# Patient Record
Sex: Male | Born: 1975 | Race: White | Hispanic: No | Marital: Married | Smoking: Former smoker
Health system: Southern US, Community
[De-identification: ages and names within clinical notes are randomized; demographics above are authoritative.]

## PROBLEM LIST (undated history)

## (undated) DIAGNOSIS — F419 Anxiety disorder, unspecified: Secondary | ICD-10-CM

## (undated) DIAGNOSIS — I4510 Unspecified right bundle-branch block: Secondary | ICD-10-CM

## (undated) DIAGNOSIS — E785 Hyperlipidemia, unspecified: Secondary | ICD-10-CM

## (undated) DIAGNOSIS — I10 Essential (primary) hypertension: Secondary | ICD-10-CM

## (undated) HISTORY — DX: Essential (primary) hypertension: I10

## (undated) HISTORY — PX: PILONIDAL CYST DRAINAGE: SHX743

## (undated) HISTORY — DX: Hyperlipidemia, unspecified: E78.5

## (undated) HISTORY — DX: Unspecified right bundle-branch block: I45.10

## (undated) HISTORY — DX: Anxiety disorder, unspecified: F41.9

## (undated) HISTORY — PX: TONSILECTOMY/ADENOIDECTOMY WITH MYRINGOTOMY: SHX6125

---

## 2000-07-29 ENCOUNTER — Ambulatory Visit (HOSPITAL_BASED_OUTPATIENT_CLINIC_OR_DEPARTMENT_OTHER): Admission: RE | Admit: 2000-07-29 | Discharge: 2000-07-29 | Payer: Self-pay | Admitting: General Surgery

## 2000-07-29 ENCOUNTER — Encounter (INDEPENDENT_AMBULATORY_CARE_PROVIDER_SITE_OTHER): Payer: Self-pay | Admitting: *Deleted

## 2003-07-29 ENCOUNTER — Ambulatory Visit (HOSPITAL_BASED_OUTPATIENT_CLINIC_OR_DEPARTMENT_OTHER): Admission: RE | Admit: 2003-07-29 | Discharge: 2003-07-29 | Payer: Self-pay | Admitting: General Surgery

## 2003-07-29 ENCOUNTER — Ambulatory Visit (HOSPITAL_COMMUNITY): Admission: RE | Admit: 2003-07-29 | Discharge: 2003-07-29 | Payer: Self-pay | Admitting: General Surgery

## 2003-07-29 ENCOUNTER — Encounter (INDEPENDENT_AMBULATORY_CARE_PROVIDER_SITE_OTHER): Payer: Self-pay | Admitting: Specialist

## 2004-06-25 ENCOUNTER — Ambulatory Visit: Payer: Self-pay | Admitting: Internal Medicine

## 2004-08-07 ENCOUNTER — Ambulatory Visit: Payer: Self-pay | Admitting: Internal Medicine

## 2005-06-07 ENCOUNTER — Ambulatory Visit: Payer: Self-pay | Admitting: Internal Medicine

## 2005-11-11 ENCOUNTER — Ambulatory Visit: Payer: Self-pay | Admitting: Internal Medicine

## 2006-07-11 ENCOUNTER — Ambulatory Visit: Payer: Self-pay | Admitting: Internal Medicine

## 2006-11-26 ENCOUNTER — Ambulatory Visit: Payer: Self-pay | Admitting: Internal Medicine

## 2007-04-29 ENCOUNTER — Ambulatory Visit: Payer: Self-pay | Admitting: Internal Medicine

## 2007-04-30 LAB — CONVERTED CEMR LAB
ALT: 25 units/L (ref 0–53)
AST: 20 units/L (ref 0–37)
Alkaline Phosphatase: 56 units/L (ref 39–117)
Basophils Relative: 0.5 % (ref 0.0–1.0)
Bilirubin, Direct: 0.2 mg/dL (ref 0.0–0.3)
CO2: 31 meq/L (ref 19–32)
Calcium: 9.4 mg/dL (ref 8.4–10.5)
Creatinine, Ser: 0.9 mg/dL (ref 0.4–1.5)
Eosinophils Relative: 4 % (ref 0.0–5.0)
GFR calc Af Amer: 125 mL/min
Glucose, Bld: 93 mg/dL (ref 70–99)
Hemoglobin: 14.5 g/dL (ref 13.0–17.0)
LDL Cholesterol: 115 mg/dL — ABNORMAL HIGH (ref 0–99)
Lymphocytes Relative: 33.7 % (ref 12.0–46.0)
Neutro Abs: 3.2 10*3/uL (ref 1.4–7.7)
Platelets: 224 10*3/uL (ref 150–400)
RDW: 12.7 % (ref 11.5–14.6)
Total Bilirubin: 1.4 mg/dL — ABNORMAL HIGH (ref 0.3–1.2)
Total Protein: 7.1 g/dL (ref 6.0–8.3)
Triglycerides: 144 mg/dL (ref 0–149)
VLDL: 29 mg/dL (ref 0–40)
WBC: 6.1 10*3/uL (ref 4.5–10.5)

## 2007-07-20 ENCOUNTER — Ambulatory Visit: Payer: Self-pay | Admitting: Internal Medicine

## 2007-07-20 DIAGNOSIS — F411 Generalized anxiety disorder: Secondary | ICD-10-CM | POA: Insufficient documentation

## 2007-07-28 ENCOUNTER — Telehealth: Payer: Self-pay | Admitting: Internal Medicine

## 2007-09-09 ENCOUNTER — Ambulatory Visit: Payer: Self-pay | Admitting: Internal Medicine

## 2007-09-09 DIAGNOSIS — E785 Hyperlipidemia, unspecified: Secondary | ICD-10-CM | POA: Insufficient documentation

## 2008-03-03 ENCOUNTER — Ambulatory Visit: Payer: Self-pay | Admitting: Internal Medicine

## 2008-03-03 ENCOUNTER — Telehealth: Payer: Self-pay | Admitting: Internal Medicine

## 2008-03-03 ENCOUNTER — Emergency Department (HOSPITAL_COMMUNITY): Admission: EM | Admit: 2008-03-03 | Discharge: 2008-03-03 | Payer: Self-pay | Admitting: Family Medicine

## 2008-03-04 LAB — CONVERTED CEMR LAB
ALT: 23 units/L (ref 0–53)
AST: 20 units/L (ref 0–37)
Basophils Absolute: 0.3 10*3/uL — ABNORMAL HIGH (ref 0.0–0.1)
Basophils Relative: 2.2 % (ref 0.0–3.0)
Bilirubin, Direct: 0.3 mg/dL (ref 0.0–0.3)
CO2: 28 meq/L (ref 19–32)
Calcium: 8.8 mg/dL (ref 8.4–10.5)
Chloride: 104 meq/L (ref 96–112)
Creatinine, Ser: 1.1 mg/dL (ref 0.4–1.5)
Glucose, Bld: 112 mg/dL — ABNORMAL HIGH (ref 70–99)
Hemoglobin: 15 g/dL (ref 13.0–17.0)
LDL Cholesterol: 108 mg/dL — ABNORMAL HIGH (ref 0–99)
Leukocytes, UA: NEGATIVE
Lymphocytes Relative: 6.6 % — ABNORMAL LOW (ref 12.0–46.0)
MCHC: 34.9 g/dL (ref 30.0–36.0)
Monocytes Relative: 6.7 % (ref 3.0–12.0)
Neutro Abs: 13.2 10*3/uL — ABNORMAL HIGH (ref 1.4–7.7)
Neutrophils Relative %: 84.2 % — ABNORMAL HIGH (ref 43.0–77.0)
Nitrite: NEGATIVE
RBC: 4.81 M/uL (ref 4.22–5.81)
Sodium: 139 meq/L (ref 135–145)
Specific Gravity, Urine: >=1.03 (ref 1.000–1.03)
Total Bilirubin: 2 mg/dL — ABNORMAL HIGH (ref 0.3–1.2)
Total CHOL/HDL Ratio: 4.9
Total Protein: 6.9 g/dL (ref 6.0–8.3)
Triglycerides: 71 mg/dL (ref 0–149)
Urobilinogen, UA: 0.2 (ref 0.0–1.0)
pH: 6 (ref 5.0–8.0)

## 2008-03-07 ENCOUNTER — Ambulatory Visit: Payer: Self-pay | Admitting: Internal Medicine

## 2008-03-18 ENCOUNTER — Ambulatory Visit: Payer: Self-pay | Admitting: Internal Medicine

## 2008-08-29 ENCOUNTER — Telehealth: Payer: Self-pay | Admitting: Internal Medicine

## 2008-08-29 ENCOUNTER — Ambulatory Visit: Payer: Self-pay | Admitting: Internal Medicine

## 2008-12-09 ENCOUNTER — Ambulatory Visit: Payer: Self-pay | Admitting: Internal Medicine

## 2010-05-28 ENCOUNTER — Ambulatory Visit: Payer: Self-pay | Admitting: Internal Medicine

## 2010-06-11 ENCOUNTER — Ambulatory Visit: Payer: Self-pay | Admitting: Internal Medicine

## 2010-06-13 ENCOUNTER — Ambulatory Visit: Payer: Self-pay | Admitting: Internal Medicine

## 2010-06-13 LAB — CONVERTED CEMR LAB
Alkaline Phosphatase: 65 units/L (ref 39–117)
BUN: 22 mg/dL (ref 6–23)
Basophils Absolute: 0 10*3/uL (ref 0.0–0.1)
Bilirubin Urine: NEGATIVE
Bilirubin, Direct: 0.1 mg/dL (ref 0.0–0.3)
CO2: 30 meq/L (ref 19–32)
Calcium: 9.4 mg/dL (ref 8.4–10.5)
Chloride: 106 meq/L (ref 96–112)
Cholesterol: 171 mg/dL (ref 0–200)
Creatinine, Ser: 0.9 mg/dL (ref 0.4–1.5)
Eosinophils Absolute: 0.3 10*3/uL (ref 0.0–0.7)
HDL: 32 mg/dL — ABNORMAL LOW (ref >39.00)
Ketones, ur: NEGATIVE mg/dL
LDL Cholesterol: 124 mg/dL — ABNORMAL HIGH (ref 0–99)
Lymphocytes Relative: 32.4 % (ref 12.0–46.0)
MCHC: 34.8 g/dL (ref 30.0–36.0)
MCV: 90.4 fL (ref 78.0–100.0)
Monocytes Absolute: 0.4 10*3/uL (ref 0.1–1.0)
Neutrophils Relative %: 53.6 % (ref 43.0–77.0)
Platelets: 204 10*3/uL (ref 150.0–400.0)
RDW: 13.2 % (ref 11.5–14.6)
Specific Gravity, Urine: 1.02 (ref 1.000–1.030)
Total Bilirubin: 1.2 mg/dL (ref 0.3–1.2)
Total Protein: 6.6 g/dL (ref 6.0–8.3)
Triglycerides: 74 mg/dL (ref 0.0–149.0)
Urine Glucose: NEGATIVE mg/dL
Urobilinogen, UA: 0.2 (ref 0.0–1.0)

## 2010-06-21 ENCOUNTER — Ambulatory Visit: Payer: Self-pay | Admitting: Internal Medicine

## 2010-06-21 DIAGNOSIS — M542 Cervicalgia: Secondary | ICD-10-CM | POA: Insufficient documentation

## 2010-06-28 ENCOUNTER — Telehealth (INDEPENDENT_AMBULATORY_CARE_PROVIDER_SITE_OTHER): Payer: Self-pay | Admitting: *Deleted

## 2010-09-11 NOTE — Progress Notes (Signed)
  Phone Note Other Incoming   Request: Send information Summary of Call: Records release from [facility] forwarded to [facility].

## 2010-09-11 NOTE — Assessment & Plan Note (Signed)
Summary: URI/ AVP'S PT /NWS   Vital Signs:  Patient profile:   35 year old male Height:      72 inches Weight:      186.50 pounds BMI:     25.39 O2 Sat:      94 % on Room air Temp:     97.4 degrees F oral Pulse rate:   79 / minute Pulse rhythm:   regular Resp:     16 per minute BP sitting:   124 / 80  (left arm) Cuff size:   large  Vitals Entered By: Rock Nephew CMA (May 28, 2010 2:33 PM)  Nutrition Counseling: Patient's BMI is greater than 25 and therefore counseled on weight management options.  O2 Flow:  Room air CC: Patient c/o cough, chest congestion w/ greenish-brown phlem x 1wk, URI symptoms Is Patient Diabetic? No Pain Assessment Patient in pain? no       Does patient need assistance? Functional Status Self care Ambulation Normal   Primary Care Provider:  Tresa Garter MD  CC:  Patient c/o cough, chest congestion w/ greenish-brown phlem x 1wk, and URI symptoms.  History of Present Illness:  URI Symptoms      This is a 35 year old man who presents with URI symptoms.  The symptoms began 2 weeks ago.  The severity is described as moderate.  The patient reports sore throat, productive cough, and sick contacts, but denies nasal congestion, clear nasal discharge, purulent nasal discharge, dry cough, and earache.  The patient denies fever, stiff neck, dyspnea, wheezing, rash, vomiting, diarrhea, use of an antipyretic, and response to antipyretic.  The patient denies headache, muscle aches, and severe fatigue.  The patient denies the following risk factors for Strep sinusitis: unilateral facial pain, unilateral nasal discharge, poor response to decongestant, double sickening, tooth pain, Strep exposure, tender adenopathy, and absence of cough.    Preventive Screening-Counseling & Management  Alcohol-Tobacco     Alcohol drinks/day: 0     Smoking Status: never     Passive Smoke Exposure: no     Tobacco Counseling: not indicated; no tobacco  use  Hep-HIV-STD-Contraception     Hepatitis Risk: no risk noted     HIV Risk: no     STD Risk: no risk noted      Sexual History:  currently monogamous.        Drug Use:  never.        Blood Transfusions:  no.    Medications Prior to Update: 1)  Vitamin D3 1000 Unit  Tabs (Cholecalciferol) .Marland Kitchen.. 1 Qd 2)  Fish Oil   Oil (Fish Oil) .Marland Kitchen.. 1 By Mouth Bid 3)  Bactrim Ds 800-160 Mg Tabs (Sulfamethoxazole-Trimethoprim) .... One By Mouth Two Times A Day For 10 Days  Current Medications (verified): 1)  Vitamin D3 1000 Unit  Tabs (Cholecalciferol) .Marland Kitchen.. 1 Qd 2)  Fish Oil   Oil (Fish Oil) .Marland Kitchen.. 1 By Mouth Bid 3)  Avelox Abc Pack 400 Mg Tabs (Moxifloxacin Hcl) .... One By Mouth Once Daily For 5 Days  Allergies (verified): No Known Drug Allergies  Past History:  Past Medical History: Last updated: 09/09/2007 Anxiety Low HDL Fam h/o CAD  Past Surgical History: Last updated: 08/29/2008 Denies surgical history  Family History: Last updated: 03/18/2008 Family History of CAD Father 29 yo Family History Hypertension  Social History: Last updated: 08/29/2008 Occupation: Married 2 kids Never Smoked Alcohol use-no Drug use-no Regular exercise-yes  Risk Factors: Alcohol Use: 0 (05/28/2010) Exercise:  yes (08/29/2008)  Risk Factors: Smoking Status: never (05/28/2010) Passive Smoke Exposure: no (05/28/2010)  Family History: Reviewed history from 03/18/2008 and no changes required. Family History of CAD Father 79 yo Family History Hypertension  Social History: Reviewed history from 08/29/2008 and no changes required. Occupation: Married 2 kids Never Smoked Alcohol use-no Drug use-no Regular exercise-yes Hepatitis Risk:  no risk noted STD Risk:  no risk noted Drug Use:  never Blood Transfusions:  no  Review of Systems  The patient denies anorexia, fever, weight loss, weight gain, hoarseness, chest pain, syncope, dyspnea on exertion, peripheral edema, headaches,  hemoptysis, abdominal pain, suspicious skin lesions, transient blindness, difficulty walking, enlarged lymph nodes, and angioedema.    Physical Exam  General:  alert, well-developed, well-nourished, well-hydrated, appropriate dress, normal appearance, healthy-appearing, cooperative to examination, and good hygiene.   Head:  normocephalic, atraumatic, no abnormalities observed, and no abnormalities palpated.   Ears:  R ear normal and L ear normal.   Nose:  nasal discharge, no mucosal pallor and mucosal erythema.  no airflow obstruction, no intranasal foreign body, no nasal polyps, no nasal mucosal lesions, no mucosal friability, no active bleeding or clots, no sinus percussion tenderness, no septum abnormalities, nasal dischargemucosal pallor, and mucosal erythema.   Mouth:  good dentition, pharynx pink and moist, no exudates, no posterior lymphoid hypertrophy, no postnasal drip, no pharyngeal crowing, no lesions, no aphthous ulcers, no tongue abnormalities, no leukoplakia, and pharyngeal erythema.   Neck:  supple, full ROM, no masses, no thyromegaly, no carotid bruits, and no cervical lymphadenopathy.   Lungs:  normal respiratory effort, no intercostal retractions, no accessory muscle use, no dullness, no fremitus, no wheezes, and R base crackles.   Heart:  normal rate, regular rhythm, no murmur, no gallop, and no rub.   Abdomen:  soft, non-tender, normal bowel sounds, no distention, no masses, no guarding, no rigidity, no rebound tenderness, no hepatomegaly, and no splenomegaly.   Msk:  No deformity or scoliosis noted of thoracic or lumbar spine.   Pulses:  R and L carotid,radial,femoral,dorsalis pedis and posterior tibial pulses are full and equal bilaterally Extremities:  No clubbing, cyanosis, edema, or deformity noted with normal full range of motion of all joints.   Neurologic:  No cranial nerve deficits noted. Station and gait are normal. Plantar reflexes are down-going bilaterally. DTRs are  symmetrical throughout. Sensory, motor and coordinative functions appear intact. Skin:  Intact without suspicious lesions or rashes Cervical Nodes:  No lymphadenopathy noted Axillary Nodes:  No palpable lymphadenopathy Inguinal Nodes:  No significant adenopathy Psych:  Cognition and judgment appear intact. Alert and cooperative with normal attention span and concentration. No apparent delusions, illusions, hallucinations   Impression & Recommendations:  Problem # 1:  BRONCHITIS-ACUTE (ICD-466.0) Assessment New  The following medications were removed from the medication list:    Bactrim Ds 800-160 Mg Tabs (Sulfamethoxazole-trimethoprim) ..... One by mouth two times a day for 10 days His updated medication list for this problem includes:    Avelox Abc Pack 400 Mg Tabs (Moxifloxacin hcl) ..... One by mouth once daily for 5 days  Take antibiotics and other medications as directed. Encouraged to push clear liquids, get enough rest, and take acetaminophen as needed. To be seen in 5-7 days if no improvement, sooner if worse.  Problem # 2:  COUGH (ICD-786.2) Assessment: New will check for pneumonia Orders: T-2 View CXR (71020TC)  Complete Medication List: 1)  Vitamin D3 1000 Unit Tabs (Cholecalciferol) .Marland Kitchen.. 1 qd 2)  Fish Oil  Oil (Fish oil) .Marland Kitchen.. 1 by mouth bid 3)  Avelox Abc Pack 400 Mg Tabs (Moxifloxacin hcl) .... One by mouth once daily for 5 days  Patient Instructions: 1)  Please schedule a follow-up appointment in 1 month. 2)  Take your antibiotic as prescribed until ALL of it is gone, but stop if you develop a rash or swelling and contact our office as soon as possible. 3)  Acute bronchitis symptoms for less than 10 days are not helped by antibiotics. take over the counter cough medications. call if no improvment in  5-7 days, sooner if increasing cough, fever, or new symptoms( shortness of breath, chest pain). Prescriptions: AVELOX ABC PACK 400 MG TABS (MOXIFLOXACIN HCL) One by mouth  once daily for 5 days  #5 x 0   Entered and Authorized by:   Etta Grandchild MD   Signed by:   Etta Grandchild MD on 05/28/2010   Method used:   Samples Given   RxID:   [id number]    Orders Added: 1)  T-2 View CXR [71020TC] 2)  Est. Patient Level IV [21308]
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
## 2010-12-28 NOTE — Op Note (Signed)
NAME:  DEMARRIO, MENGES A                           ACCOUNT NO.:  [id number]   MEDICAL RECORD NO.:  [id number]                   PATIENT TYPE:  AMB   LOCATION:  [facility]                                  FACILITY:  [facility]   PHYSICIAN:  Adolph Pollack, M.D.            DATE OF BIRTH:  09-Jun-1976   DATE OF PROCEDURE:  07/29/2003  DATE OF DISCHARGE:                                 OPERATIVE REPORT   PREOPERATIVE DIAGNOSIS:  Chronically-inflamed pilonidal cyst.   POSTOPERATIVE DIAGNOSIS:  Chronically-inflamed pilonidal cyst.   PROCEDURE:  Extensive pilonidal cystectomy by way of open technique.   SURGEON:  Adolph Pollack, M.D.   ANESTHESIA:  General.   INDICATIONS FOR PROCEDURE:  The patient is a 35 year old male who has had  intermittent problems with pain and drainage from pilonidal cysts.  It had  been coming and going for the past two years, and now he presents for an  excision of the areas.  He has three separate areas in the gluteal cleft  region.   DESCRIPTION OF PROCEDURE:  He is seen in the holding area and then brought  to the operating room and placed supine on the stretcher.  A general  anesthetic was administered.  He was then placed in the prone position with  appropriate padding.  The gluteal cleft area was shaved, retracted with  tape, and then sterilely prepped and draped.  An elliptical incision was  made to include all the inflamed areas, as well as normal skin.  The cautery  was used to excise the subcutaneous tissue down to the presacral fascia, and  the specimen was handed off the field.  The wound was inspected, and cautery was used for hemostasis.  Plain  Marcaine was then injected in the superficial and deep tissues of the wound  for local anesthetic effect.  The wound was inspected again, and when it was  hemostatic, a moist gauze was packed into the wound tightly, followed by a  bulky dry dressing.  He subsequently was turned back into the supine position,  extubated, and  taken to the recovery room in satisfactory condition.  There were no  apparent complications.   DISPOSITION:  His wife is a Engineer, civil (consulting), so I have asked her to start his dressing  changes in two to three days with normal saline, down to dry dressing  changes.  He will be seen back in the office in a couple of weeks for  followup.  He was given Tylox for pain.                                               Adolph Pollack, M.D.    Kari Baars  D:  07/29/2003  T:  07/31/2003  Job:  [id number]

## 2011-10-04 IMAGING — CR DG CHEST 2V
2 series · 2 of 2 positions shown · non-contrast
Comparison: None.

CLINICAL DATA: Cough, shortness of breath

CHEST - 2 VIEW

[view not recorded (1 of 2)]
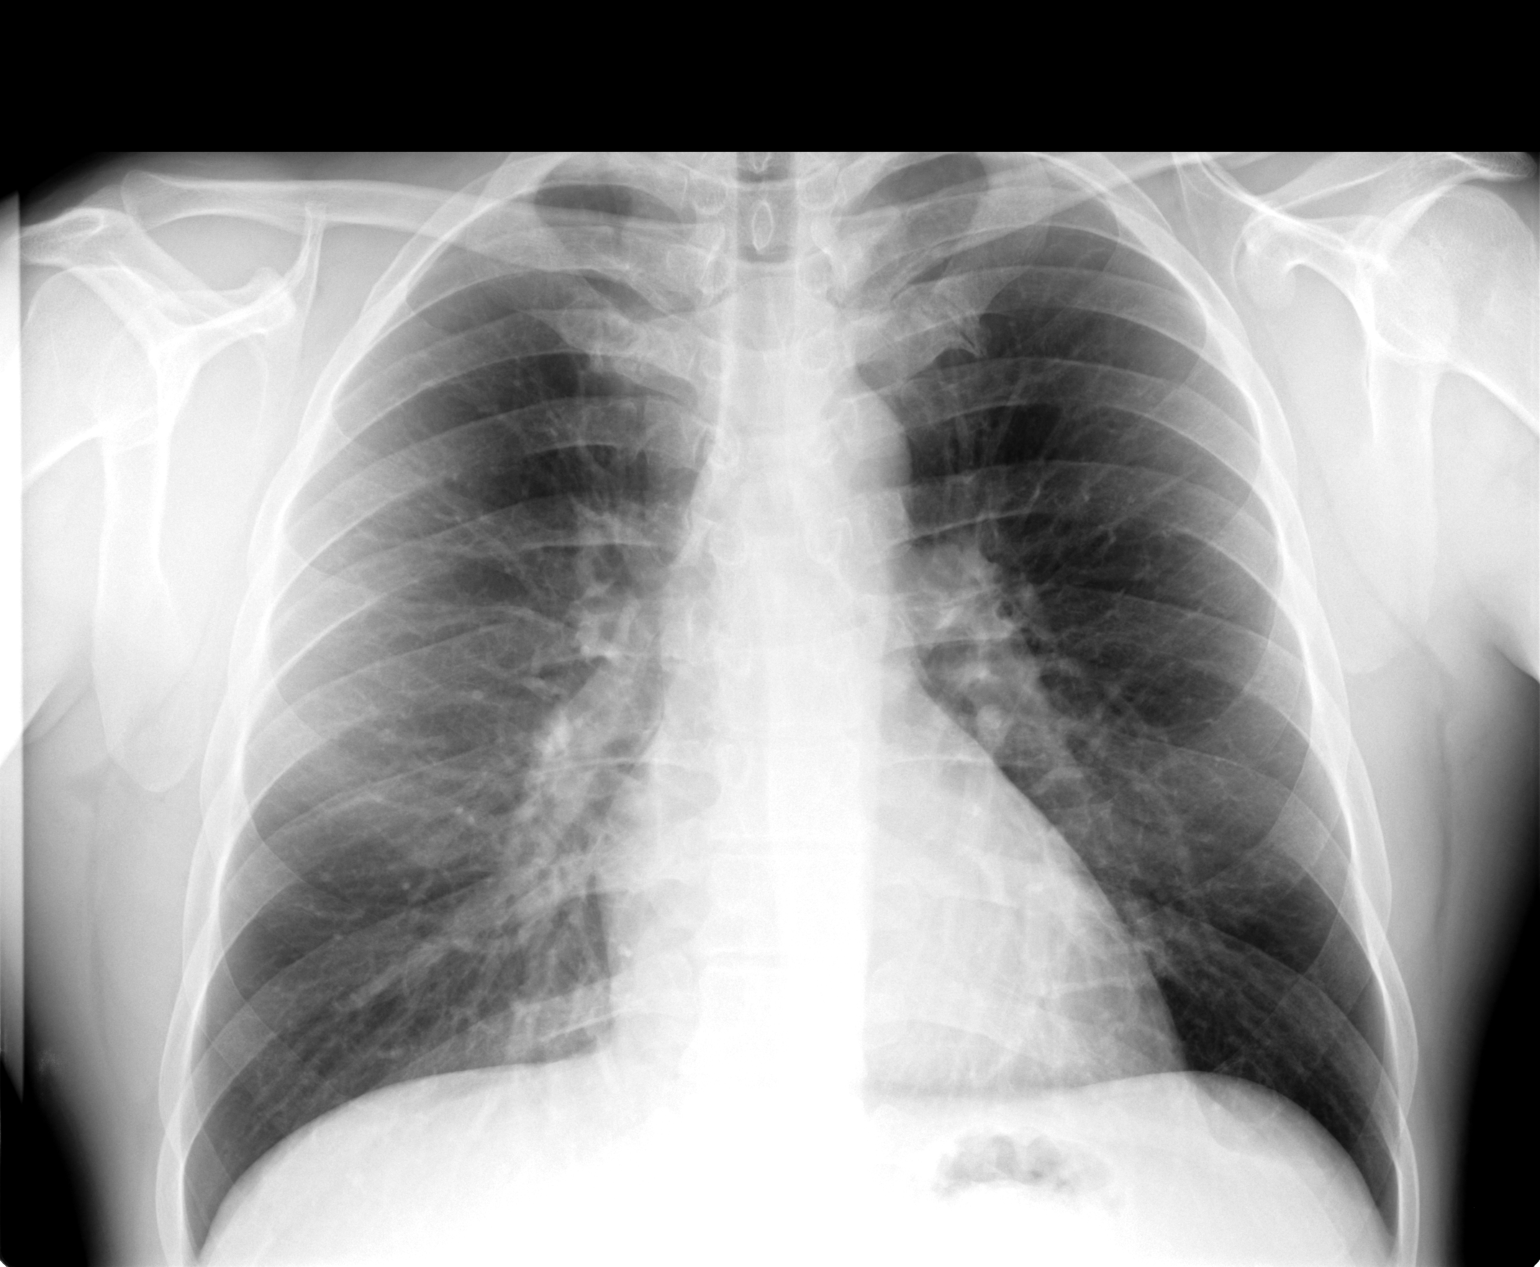

[view not recorded (2 of 2)]
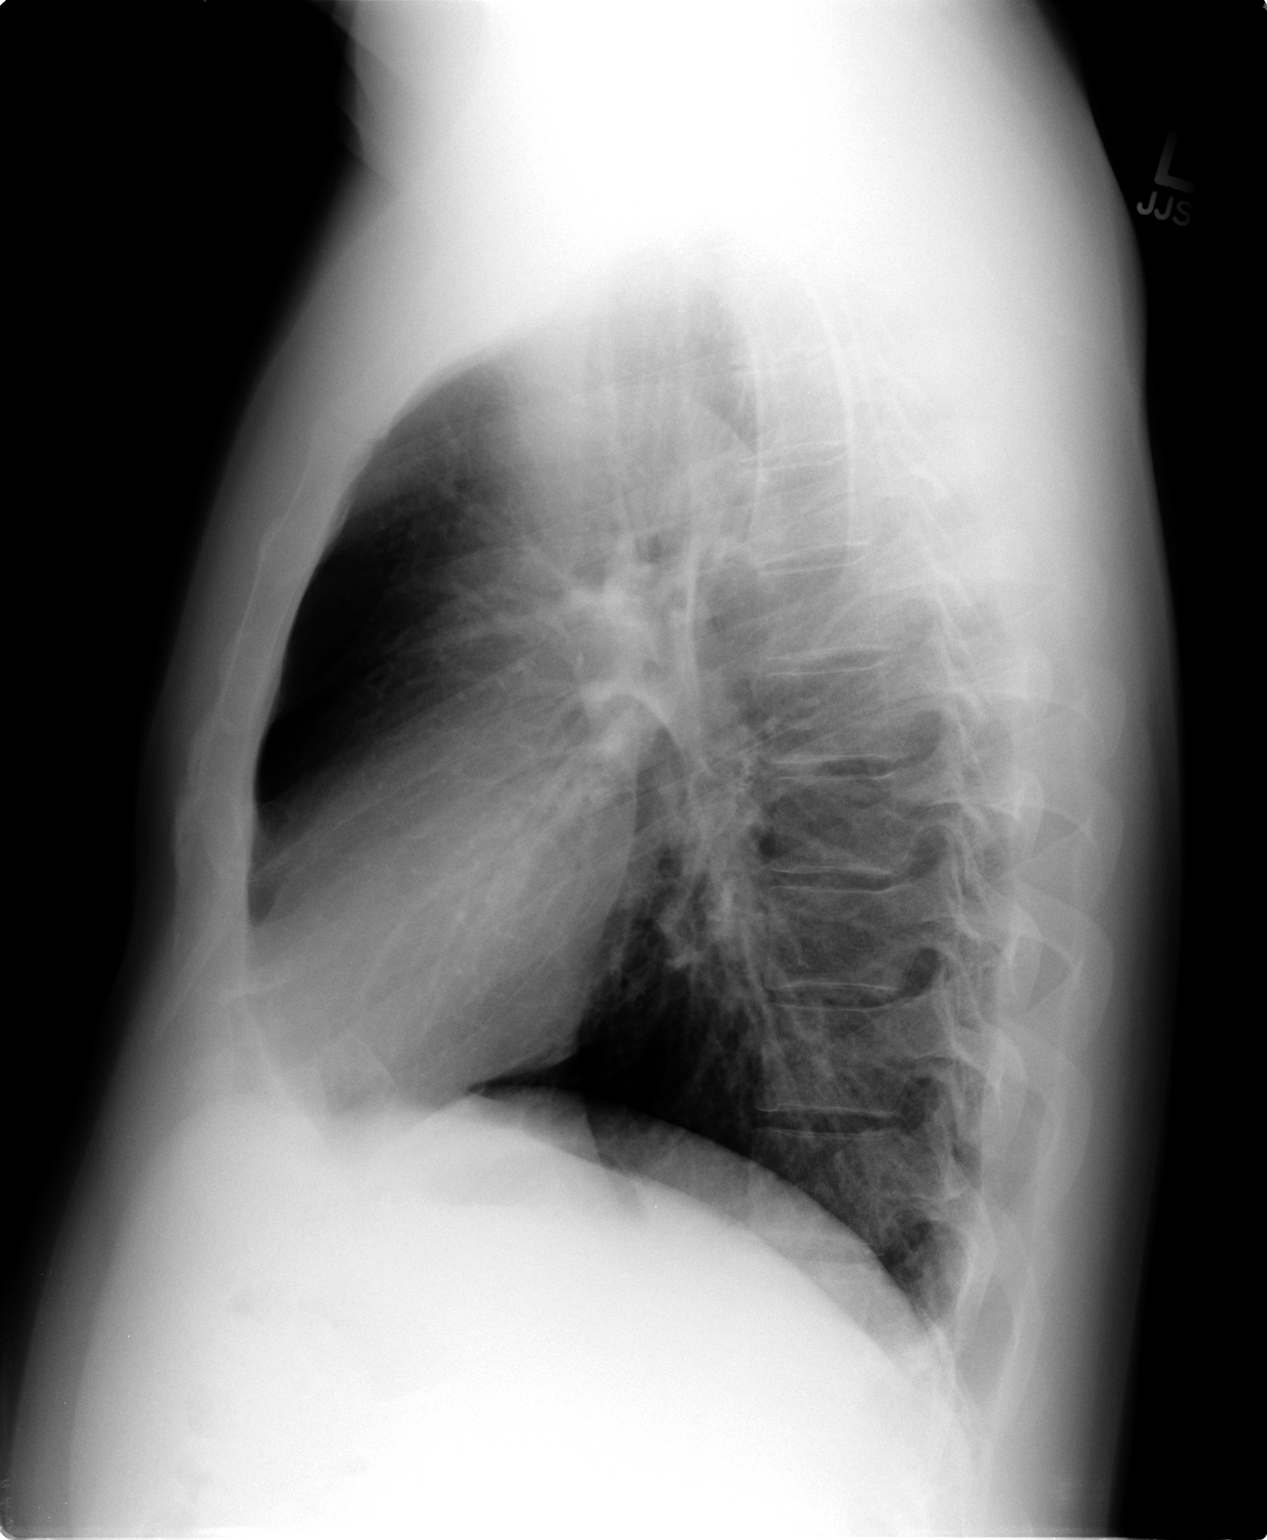

[2 of 2 positions shown; findings below may reference images not displayed]

FINDINGS: The lungs are clear bilaterally.  No confluent airspace
opacities, pleural effuions or pneumothoracies are seen.  The heart
is normal in size and contour.  The upper abdomen and osseous
structures are normal.
IMPRESSION: No acute cardiopulmonary disease.

## 2015-10-03 ENCOUNTER — Encounter (INDEPENDENT_AMBULATORY_CARE_PROVIDER_SITE_OTHER): Payer: 59

## 2015-10-03 DIAGNOSIS — I499 Cardiac arrhythmia, unspecified: Secondary | ICD-10-CM | POA: Diagnosis not present

## 2018-02-17 ENCOUNTER — Ambulatory Visit (INDEPENDENT_AMBULATORY_CARE_PROVIDER_SITE_OTHER): Payer: 59 | Admitting: Cardiology

## 2018-02-17 ENCOUNTER — Encounter: Payer: Self-pay | Admitting: Cardiology

## 2018-02-17 VITALS — BP 142/96 | HR 62 | Ht 70.6 in | Wt 186.0 lb

## 2018-02-17 DIAGNOSIS — E785 Hyperlipidemia, unspecified: Secondary | ICD-10-CM | POA: Diagnosis not present

## 2018-02-17 DIAGNOSIS — Z8249 Family history of ischemic heart disease and other diseases of the circulatory system: Secondary | ICD-10-CM | POA: Diagnosis not present

## 2018-02-17 DIAGNOSIS — R0789 Other chest pain: Secondary | ICD-10-CM

## 2018-02-17 DIAGNOSIS — R079 Chest pain, unspecified: Secondary | ICD-10-CM

## 2018-02-17 DIAGNOSIS — I4510 Unspecified right bundle-branch block: Secondary | ICD-10-CM

## 2018-02-17 NOTE — Progress Notes (Signed)
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 The pt tells me saw Johnny Johnny Day in the past and had a what he describes as a nuclear stress test that was normal. There is also a Holter monitor in the records that showed PACS and short runs of PAT in 2017. The patient does have treated HLD and a FM Hx of early CAD. He has a chronic RBBB. The pt tells me after his bronchitis he noted left sided "tightness". In retrospect he admits it was positional. It has completely resolved and he denies any unusual DOE or chest pain.    Current Outpatient Medications  Medication Sig Dispense Refill  . atorvastatin (LIPITOR) 10 MG tablet Take 10 mg by mouth daily.    . fluticasone (FLONASE ALLERGY RELIEF) 50 MCG/ACT nasal spray Place into both nostrils as needed for allergies or rhinitis.    . sildenafil (REVATIO) 20 MG tablet Take 20 mg by mouth as needed.     No current facility-administered medications for this visit.     Allergies no known allergies  Past Medical History:  Diagnosis Date  . Dyslipidemia   . RBBB     Social History   Socioeconomic History  . Marital status: Married    Spouse name: Not on file  . Number of children: Not on file  . Years of education: Not on file  . Highest education level: Not on file  Occupational History  . Not on file  Social Needs  . Financial resource strain: Not on file  . Food insecurity:    Worry: Not on file    Inability: Not on file  . Transportation needs:    Medical: Not on file    Non-medical: Not on file  Tobacco Use  . Smoking status: Former Smoker    Packs/day: 10.00    Years: 15.00    Pack years: 150.00    Types: Cigarettes    Last attempt to quit: 1999    Years since quitting: 20.5  .  Smokeless tobacco: Never Used  Substance and Sexual Activity  . Alcohol use: Yes    Comment: social occassions  . Drug use: Not on file  . Sexual activity: Not on file  Lifestyle  . Physical activity:    Days per week: Not on file    Minutes per session: Not on file  . Stress: Not on file  Relationships  . Social connections:    Talks on phone: Not on file    Gets together: Not on file    Attends religious service: Not on file    Active member of club or organization: Not on file    Attends meetings of clubs or organizations: Not on file    Relationship status: Not on file  . Intimate partner violence:    Fear of current or ex partner: Not on file    Emotionally abused: Not on file    Physically abused: Not on file    Forced sexual activity: Not on file  Other Topics Concern  . Not on file  Social History Narrative  . Not on file     Family History  Problem Relation Age of Onset  . Hypertension  Mother   . CAD Father        MI in 7150's  . Stroke Father      Review of Systems: General: negative for chills, fever, night sweats or weight changes.  Cardiovascular: negative for chest pain, dyspnea on exertion, edema, orthopnea, palpitations, paroxysmal nocturnal dyspnea or shortness of breath Dermatological: negative for rash Respiratory: negative for cough or wheezing Urologic: negative for hematuria Abdominal: negative for nausea, vomiting, diarrhea, bright red blood per rectum, melena, or hematemesis Neurologic: negative for visual changes, syncope, or dizziness All other systems reviewed and are otherwise negative except as noted above.    Blood pressure (!) 142/96, pulse 62, height 5' 10.6" (1.793 m), weight 186 lb (84.4 kg), SpO2 99 %.  General appearance: alert, cooperative and no distress Neck: no carotid bruit and no JVD Lungs: clear to auscultation bilaterally Heart: regular rate and rhythm Abdomen: soft, non-tender; bowel sounds normal; no masses,  no  organomegaly Extremities: extremities normal, atraumatic, no cyanosis or edema Pulses: 2+ and symmetric Skin: Skin color, texture, turgor normal. No rashes or lesions Neurologic: Grossly normal  Repeat B/P by me with regular cuff- 122/80  EKG NSR, RBBB  ASSESSMENT AND PLAN:   Chest pain, atypical Pt had left sided chest pain after an episode of bronchitis- now resolved  RBBB Chronic  Dyslipidemia Followed by Johnny Day- patient informed his LDL goal is 100 or less  Family history of coronary artery disease in father Father had an MI in his 7350's   PLAN  Discussed with Johnny Johnny SabalBerry today in the office. We recommend risk factor modification.             Goal LDL is 100 or less             Goal B/P is close to 120/80- he has been advised to monitor his B/P a few times a week              Exercise 150 minutes a week- more cardio, less weights at the gym              I discussed symptoms of typical angina and urged him to contact us if any arise.             We will be happy to see him in the future as needed.  Johnny ShelterLuke Ormand Senn PA-C 02/17/2018 9:35 AM

## 2018-02-17 NOTE — Assessment & Plan Note (Signed)
Father had an MI in his 50's 

## 2018-02-17 NOTE — Assessment & Plan Note (Addendum)
Chronic. 

## 2018-02-17 NOTE — Assessment & Plan Note (Signed)
Pt had left sided chest pain after an episode of bronchitis- now resolved

## 2018-02-17 NOTE — Patient Instructions (Signed)
Follow Up as needed. No changes have been made today.      Fat and Cholesterol Restricted Diet Getting too much fat and cholesterol in your diet may cause health problems. Following this diet helps keep your fat and cholesterol at normal levels. This can keep you from getting sick. What types of fat should I choose?  Choose monosaturated and polyunsaturated fats. These are found in foods such as olive oil, canola oil, flaxseeds, walnuts, almonds, and seeds.  Eat more omega-3 fats. Good choices include salmon, mackerel, sardines, tuna, flaxseed oil, and ground flaxseeds.  Limit saturated fats. These are in animal products such as meats, butter, and cream. They can also be in plant products such as palm oil, palm kernel oil, and coconut oil.  Avoid foods with partially hydrogenated oils in them. These contain trans fats. Examples of foods that have trans fats are stick margarine, some tub margarines, cookies, crackers, and other baked goods. What general guidelines do I need to follow?  Check food labels. Look for the words "trans fat" and "saturated fat."  When preparing a meal: ? Fill half of your plate with vegetables and green salads. ? Fill one fourth of your plate with whole grains. Look for the word "whole" as the first word in the ingredient list. ? Fill one fourth of your plate with lean protein foods.  Eat more foods that have fiber, like apples, carrots, beans, peas, and barley.  Eat more home-cooked foods. Eat less at restaurants and buffets.  Limit or avoid alcohol.  Limit foods high in starch and sugar.  Limit fried foods.  Cook foods without frying them. Baking, boiling, grilling, and broiling are all great options.  Lose weight if you are overweight. Losing even a small amount of weight can help your overall health. It can also help prevent diseases such as diabetes and heart disease. What foods can I eat? Grains Whole grains, such as whole wheat or whole  grain breads, crackers, cereals, and pasta. Unsweetened oatmeal, bulgur, barley, quinoa, or brown rice. Corn or whole wheat flour tortillas. Vegetables Fresh or frozen vegetables (raw, steamed, roasted, or grilled). Green salads. Fruits All fresh, canned (in natural juice), or frozen fruits. Meat and Other Protein Products Ground beef (85% or leaner), grass-fed beef, or beef trimmed of fat. Skinless chicken or [place]. Ground chicken or [place]. Pork trimmed of fat. All fish and seafood. Eggs. Dried beans, peas, or lentils. Unsalted nuts or seeds. Unsalted canned or dry beans. Dairy Low-fat dairy products, such as skim or 1% milk, 2% or reduced-fat cheeses, low-fat ricotta or cottage cheese, or plain low-fat yogurt. Fats and Oils Tub margarines without trans fats. Light or reduced-fat mayonnaise and salad dressings. Avocado. Olive, canola, sesame, or safflower oils. Natural peanut or almond butter (choose ones without added sugar and oil). The items listed above may not be a complete list of recommended foods or beverages. Contact your dietitian for more options. What foods are not recommended? Grains White bread. White pasta. White rice. Cornbread. Bagels, pastries, and croissants. Crackers that contain trans fat. Vegetables White potatoes. Corn. Creamed or fried vegetables. Vegetables in a cheese sauce. Fruits Dried fruits. Canned fruit in light or heavy syrup. Fruit juice. Meat and Other Protein Products Fatty cuts of meat. Ribs, chicken wings, bacon, sausage, bologna, salami, chitterlings, fatback, hot dogs, bratwurst, and packaged luncheon meats. Liver and organ meats. Dairy Whole or 2% milk, cream, half-and-half, and cream cheese. Whole milk cheeses. Whole-fat or sweetened yogurt. Full-fat cheeses. Nondairy  creamers and whipped toppings. Processed cheese, cheese spreads, or cheese curds. Sweets and Desserts Corn syrup, sugars, honey, and molasses. Candy. Jam and jelly. Syrup. Sweetened  cereals. Cookies, pies, cakes, donuts, muffins, and ice cream. Fats and Oils Butter, stick margarine, lard, shortening, ghee, or bacon fat. Coconut, palm kernel, or palm oils. Beverages Alcohol. Sweetened drinks (such as sodas, lemonade, and fruit drinks or punches). The items listed above may not be a complete list of foods and beverages to avoid. Contact your dietitian for more information. This information is not intended to replace advice given to you by your health care provider. Make sure you discuss any questions you have with your health care provider. Document Released: 01/28/2012 Document Revised: 04/04/2016 Document Reviewed: 10/28/2013 Elsevier Interactive Patient Education  [employer].

## 2018-02-17 NOTE — Assessment & Plan Note (Signed)
Followed by Dr Renne CriglerPharr- patient informed his LDL goal is 100 or less

## 2021-03-06 ENCOUNTER — Other Ambulatory Visit: Payer: Self-pay | Admitting: Internal Medicine

## 2021-03-06 DIAGNOSIS — E7800 Pure hypercholesterolemia, unspecified: Secondary | ICD-10-CM

## 2021-03-07 ENCOUNTER — Encounter: Payer: Self-pay | Admitting: Gastroenterology

## 2021-03-28 ENCOUNTER — Ambulatory Visit
Admission: RE | Admit: 2021-03-28 | Discharge: 2021-03-28 | Disposition: A | Discharge status: Home / Self Care | Payer: No Typology Code available for payment source | Source: Ambulatory Visit | Attending: Internal Medicine | Admitting: Internal Medicine

## 2021-03-28 DIAGNOSIS — E7800 Pure hypercholesterolemia, unspecified: Secondary | ICD-10-CM

## 2021-04-04 ENCOUNTER — Encounter: Payer: Self-pay | Admitting: Gastroenterology

## 2021-04-04 ENCOUNTER — Ambulatory Visit (INDEPENDENT_AMBULATORY_CARE_PROVIDER_SITE_OTHER): Payer: 59 | Admitting: Gastroenterology

## 2021-04-04 VITALS — BP 120/70 | HR 70 | Ht 71.0 in | Wt 209.6 lb

## 2021-04-04 DIAGNOSIS — K625 Hemorrhage of anus and rectum: Secondary | ICD-10-CM | POA: Diagnosis not present

## 2021-04-04 DIAGNOSIS — Z1211 Encounter for screening for malignant neoplasm of colon: Secondary | ICD-10-CM

## 2021-04-04 DIAGNOSIS — Z5320 Procedure and treatment not carried out because of patient's decision for unspecified reasons: Secondary | ICD-10-CM | POA: Insufficient documentation

## 2021-04-04 DIAGNOSIS — R109 Unspecified abdominal pain: Secondary | ICD-10-CM | POA: Insufficient documentation

## 2021-04-04 MED ORDER — PLENVU 140 G PO SOLR: 1.0000 | ORAL | 0 refills | Status: DC

## 2021-04-04 NOTE — Patient Instructions (Signed)
It was my pleasure to provide care to you today. Based on our discussion, I am providing you with my recommendations below:  RECOMMENDATION(S):   COLONOSCOPY:   You have been scheduled for a colonoscopy. Please follow written instructions given to you at your visit today.   PREP:   Please pick up your prep supplies at the pharmacy within the next 1-3 days.  INHALERS:   If you use inhalers (even only as needed), please bring them with you on the day of your procedure.  COLONOSCOPY TIPS:  To reduce nausea and dehydration, stay well hydrated for 3-4 days prior to the exam.  To prevent skin/hemorrhoid irritation - prior to wiping, put A&Dointment or vaseline on the toilet paper. Keep a towel or pad on the bed.  BEFORE STARTING YOUR PREP, drink  64oz of clear liquids in the morning. This will help to flush the colon and will ensure you are well hydrated!!!!  NOTE - This is in addition to the fluids required for to complete your prep. Use of a flavored hard candy, such as grape Rubin Payor, can counteract some of the flavor of the prep and may prevent some nausea.   FOLLOW UP:  After your procedure, you will receive a call from my office staff regarding my recommendation for follow up.  BMI:   If you are age 33 or younger, your body mass index should be between 19-25. Your Body mass index is 29.23 kg/m. If this is out of the aformentioned range listed, please consider follow up with your Primary Care Provider.   MY CHART:  The [place] [facility] providers would like to encourage you to use [facility] to communicate with providers for non-urgent requests or questions.  Due to long hold times on the telephone, sending your provider a message by [facility] may be a faster and more efficient way to get a response.  Please allow 48 business hours for a response.  Please remember that this is for non-urgent requests.   Thank you for trusting me with your gastrointestinal care!    Doug Sou  PA-C

## 2021-04-04 NOTE — Progress Notes (Signed)
04/04/2021 Johnny Day [id number] April 13, 1976   HISTORY OF PRESENT ILLNESS: This is a 45 year old male who is new to our practice.  He is here today at the request of his PCP, Dr. Renne Crigler, for evaluation regarding rectal bleeding and abdominal pain.  He tells me that about a month ago he had an episode of rectal bleeding.  This is described as bright red blood on the toilet paper that lasted about a day or so.  He states that he soaked in the bathtub and then the bleeding resolved.  He says that he has never had a colonoscopy in the past.  For the past week to week and a half he has been experiencing some left-sided abdominal/flank pain.  At its worst he says it is about a 2 or 3 out of 10 on the pain scale.  He says that it is just a constant dull aching.  He says that his bowels are moving fine, no change in bowel habits.  He said maybe some mild nausea at times, but nothing significant.  No fevers or chills.  He says that he thought maybe he pulled a muscle while golfing or something, but it does not really hurt with movement.  Is also not affected by eating or bowel habits.   Past Medical History:  Diagnosis Date   Anxiety    Dyslipidemia    Hypertension    RBBB    Past Surgical History:  Procedure Laterality Date   PILONIDAL CYST DRAINAGE     TONSILECTOMY/ADENOIDECTOMY WITH MYRINGOTOMY      reports that he quit smoking about 23 years ago. His smoking use included cigarettes. He has a 150.00 pack-year smoking history. He has never used smokeless tobacco. He reports current alcohol use. He reports that he does not use drugs. family history includes CAD in his father; Heart disease in his father; Hypertension in his mother; Stroke in his father. No Known Allergies    Outpatient Encounter Medications as of 04/04/2021  Medication Sig   amLODipine (NORVASC) 10 MG tablet Take 10 mg by mouth daily.   atorvastatin (LIPITOR) 10 MG tablet Take 10 mg by mouth daily.   fluticasone (FLONASE)  50 MCG/ACT nasal spray Place into both nostrils as needed for allergies or rhinitis.   losartan-hydrochlorothiazide (HYZAAR) 100-12.5 MG tablet Take 1 tablet by mouth daily.   sildenafil (REVATIO) 20 MG tablet Take 20 mg by mouth as needed.   No facility-administered encounter medications on file as of 04/04/2021.     REVIEW OF SYSTEMS  : All other systems reviewed and negative except where noted in the History of Present Illness.   PHYSICAL EXAM: BP 120/70   Pulse 70   Ht 5\' 11"  (1.803 m)   Wt 209 lb 9.6 oz (95.1 kg)   SpO2 98%   BMI 29.23 kg/m  General: Well developed white male in no acute distress Head: Normocephalic and atraumatic Eyes:  Sclerae anicteric, conjunctiva pink. Ears: Normal auditory acuity Lungs: Clear throughout to auscultation; no W/R/R. Heart: Regular rate and rhythm; no M/R/G. Abdomen: Soft, non-distended.  BS present.  Non-tender. Rectal:  Will be done at the time of colonoscopy. Musculoskeletal: Symmetrical with no gross deformities  Skin: No lesions on visible extremities Extremities: No edema  Neurological: Alert oriented x 4, grossly non-focal Psychological:  Alert and cooperative. Normal mood and affect  ASSESSMENT AND PLAN: *Left sided abdominal/left flank pain:  Not tender on exam.  ? Musculoskeletal source.  Only about a  2 or 3 out of 10 on the pain scale at its worst.  Advised to try Tylenol, heating pad, topical Salonpas patches, etc. for now. *Rectal bleeding:  One episode about a month ago, bright red blood.  Never had a colonoscopy in the past.  Will schedule with Dr. Myrtie Neither.  The risks, benefits, and alternatives to colonoscopy were discussed with the patient and he consents to proceed.  *CRC screening  CC:  Merri Brunette, MD

## 2021-04-05 NOTE — Progress Notes (Addendum)
04/06/21 - Lab results received and given to Dr. Myrtie Neither for review.  04/05/21 - Called PCP's office to inquire if pt had recent CBC. Confirmed pt had CBC drawn on 04/02/21. Requested labs be faxed to our office @ [phone]. Reminder created for myself to f/u on receipt of these labs.

## 2021-04-05 NOTE — Progress Notes (Addendum)
____________________________________________________________  Attending physician addendum:  Thank you for sending this case to me. I have reviewed the entire note and agree with the plan.  Please have this patient come by the lab one day next week for a CBC if he did not have one with primary care prior to the visit with [place].  Amada Jupiter, MD   Addendum 04/06/2021  Labs obtained from PCP office  On 04/02/2021, WBC 6.1, hemoglobin 14.8, hematocrit 42.8, platelet 256  - H. Myrtie Neither, MD  ____________________________________________________________

## 2021-04-17 ENCOUNTER — Encounter: Payer: 59 | Admitting: Gastroenterology

## 2021-04-19 ENCOUNTER — Encounter: Payer: 59 | Admitting: Gastroenterology

## 2021-05-02 ENCOUNTER — Other Ambulatory Visit: Payer: Self-pay | Admitting: Urology

## 2021-05-02 ENCOUNTER — Encounter: Payer: 59 | Admitting: Gastroenterology

## 2021-05-02 DIAGNOSIS — N402 Nodular prostate without lower urinary tract symptoms: Secondary | ICD-10-CM

## 2021-05-29 ENCOUNTER — Ambulatory Visit
Admission: RE | Admit: 2021-05-29 | Discharge: 2021-05-29 | Disposition: A | Discharge status: Home / Self Care | Payer: 59 | Source: Ambulatory Visit | Attending: Urology | Admitting: Urology

## 2021-05-29 DIAGNOSIS — N402 Nodular prostate without lower urinary tract symptoms: Secondary | ICD-10-CM

## 2021-05-29 MED ORDER — GADOBENATE DIMEGLUMINE 529 MG/ML IV SOLN
20.0000 mL | Freq: Once | INTRAVENOUS | Status: AC | PRN
  Administered 2021-05-29: 20 mL via INTRAVENOUS

## 2022-06-04 ENCOUNTER — Encounter: Payer: 59 | Admitting: Gastroenterology

## 2022-07-16 DIAGNOSIS — N402 Nodular prostate without lower urinary tract symptoms: Secondary | ICD-10-CM | POA: Insufficient documentation

## 2022-07-18 DIAGNOSIS — Z8042 Family history of malignant neoplasm of prostate: Secondary | ICD-10-CM | POA: Insufficient documentation

## 2022-10-17 ENCOUNTER — Emergency Department (HOSPITAL_COMMUNITY)
Admission: EM | Admit: 2022-10-17 | Discharge: 2022-10-17 | Disposition: A | Discharge status: Home / Self Care | Payer: BC Managed Care – PPO | Attending: Emergency Medicine | Admitting: Emergency Medicine

## 2022-10-17 ENCOUNTER — Encounter (HOSPITAL_COMMUNITY): Payer: Self-pay

## 2022-10-17 DIAGNOSIS — L0291 Cutaneous abscess, unspecified: Secondary | ICD-10-CM

## 2022-10-17 DIAGNOSIS — Z87891 Personal history of nicotine dependence: Secondary | ICD-10-CM | POA: Insufficient documentation

## 2022-10-17 DIAGNOSIS — Z1152 Encounter for screening for COVID-19: Secondary | ICD-10-CM | POA: Insufficient documentation

## 2022-10-17 DIAGNOSIS — J40 Bronchitis, not specified as acute or chronic: Secondary | ICD-10-CM | POA: Diagnosis not present

## 2022-10-17 DIAGNOSIS — R059 Cough, unspecified: Secondary | ICD-10-CM | POA: Diagnosis present

## 2022-10-17 DIAGNOSIS — R042 Hemoptysis: Secondary | ICD-10-CM | POA: Insufficient documentation

## 2022-10-17 MED ORDER — LIDOCAINE-EPINEPHRINE (PF) 2 %-1:200000 IJ SOLN
10.0000 mL | Freq: Once | INTRAMUSCULAR | Status: AC
  Administered 2022-10-17: 10 mL
  Filled 2022-10-17: qty 20

## 2022-10-17 MED ORDER — DOXYCYCLINE HYCLATE 100 MG PO CAPS: 100.0000 mg | ORAL_CAPSULE | Freq: Two times a day (BID) | ORAL | 0 refills | Status: DC

## 2022-10-17 NOTE — ED Provider Notes (Signed)
[facility] Provider Note   CSN: [id number] Arrival date & time: 10/17/22  [id number]     History  Chief Complaint  Patient presents with   Abscess    Johnny Day is a 47 y.o. male.  Johnny Day is a 47 y.o. male with a history of hypertension, dyslipidemia and anxiety, who presents to the ED with concern for possible abscess on the right side of his back.  He reports he initially thought this was either a cyst or a lipoma and had been there for about a year.  He saw a dermatologist for it 6 months ago and they told him not to worry about it unless it started to cause pain or bother him.  2 weeks ago he notes that it started to become more irritated and uncomfortable, but over the past 4 days it has been red, hot and very tender to the touch.  He has not noted any drainage from the area.  No fevers or chills.  Reports remote history of some prior abscesses, no underlying history of diabetes.  No other aggravating or alleviating factors.  The history is provided by the patient.  Abscess Associated symptoms: no fever        Home Medications Prior to Admission medications   Medication Sig Start Date End Date Taking? Authorizing Provider  amLODipine (NORVASC) 10 MG tablet Take 10 mg by mouth daily.    [provider]  atorvastatin (LIPITOR) 10 MG tablet Take 10 mg by mouth daily.    [provider]  doxycycline (VIBRAMYCIN) 100 MG capsule Take 1 capsule (100 mg total) by mouth 2 (two) times daily. 10/17/22   Jacqlyn Larsen, PA-C  fluticasone [facility] [place]) 50 MCG/ACT nasal spray Place into both nostrils as needed for allergies or rhinitis.    [provider]  losartan-hydrochlorothiazide (HYZAAR) 100-12.5 MG tablet Take 1 tablet by mouth daily.    [provider]  PEG-KCl-NaCl-NaSulf-Na Asc-C (PLENVU) 140 g SOLR Take 1 kit by mouth as directed. 04/04/21   Doran Stabler, MD  sildenafil (REVATIO) 20 MG tablet Take  20 mg by mouth as needed.    [provider]      Allergies    Patient has no known allergies.    Review of Systems   Review of Systems  Constitutional:  Negative for chills and fever.  Skin:  Positive for color change.       Abscess    Physical Exam Updated Vital Signs BP (!) 139/96 (BP Location: Right Arm)   Pulse 69   Temp 98.3 F (36.8 C) (Oral)   Resp 16   Ht '5\' 11"'$  (1.803 m)   Wt 97.1 kg   SpO2 100%   BMI 29.85 kg/m  Physical Exam Vitals and nursing note reviewed.  Constitutional:      General: He is not in acute distress.    Appearance: Normal appearance. He is well-developed. He is not ill-appearing or diaphoretic.  HENT:     Head: Normocephalic and atraumatic.  Eyes:     General:        Right eye: No discharge.        Left eye: No discharge.  Pulmonary:     Effort: Pulmonary effort is normal. No respiratory distress.  Musculoskeletal:     Comments: 3 x 3 cm area of erythema and fluctuance in the right mid back with a few centimeters of additional surrounding erythema, small amount  of expressible purulent drainage with palpation.  Skin:    General: Skin is warm and dry.  Neurological:     Mental Status: He is alert and oriented to person, place, and time.     Coordination: Coordination normal.  Psychiatric:        Mood and Affect: Mood normal.        Behavior: Behavior normal.     ED Results / Procedures / Treatments   Labs (all labs ordered are listed, but only abnormal results are displayed) Labs Reviewed - No data to display  EKG None  Radiology No results found.  Procedures .Marland KitchenIncision and Drainage  Date/Time: 10/17/2022 11:55 AM  Performed by: Jacqlyn Larsen, PA-C Authorized by: Jacqlyn Larsen, PA-C   Consent:    Consent obtained:  Verbal   Consent given by:  Patient   Risks, benefits, and alternatives were discussed: yes     Risks discussed:  Bleeding, incomplete drainage, pain, damage to other organs and infection    Alternatives discussed:  No treatment Universal protocol:    Procedure explained and questions answered to patient or proxy's satisfaction: yes     Patient identity confirmed:  Verbally with patient Location:    Type:  Abscess   Size:  3 x3 cm   Location:  Trunk   Trunk location:  Back Pre-procedure details:    Skin preparation:  Chlorhexidine Sedation:    Sedation type:  None Anesthesia:    Anesthesia method:  Local infiltration   Local anesthetic:  Lidocaine 2% WITH epi Procedure type:    Complexity:  Complex Procedure details:    Ultrasound guidance: yes     Needle aspiration: no     Incision types:  Single straight   Incision depth:  Dermal   Wound management:  Probed and deloculated and irrigated with saline   Drainage:  Bloody and purulent   Drainage amount:  Moderate   Wound treatment:  Wound left open   Packing materials:  1/4 in iodoform gauze Post-procedure details:    Procedure completion:  Tolerated well, no immediate complications Ultrasound ED Soft Tissue  Date/Time: 10/17/2022 12:02 PM  Performed by: Jacqlyn Larsen, PA-C Authorized by: Jacqlyn Larsen, PA-C   Procedure details:    Indications: localization of abscess and evaluate for cellulitis     Transverse view:  Visualized   Longitudinal view:  Visualized   Images: archived   Location:    Location: lower back   Findings:     abscess present     Medications Ordered in ED Medications  lidocaine-EPINEPHrine (XYLOCAINE W/EPI) 2 %-1:200000 (PF) injection 10 mL (10 mLs Infiltration Given 10/17/22 1130)    ED Course/ Medical Decision Making/ A&P                             Medical Decision Making  Patient presents to the ED with abscess amenable to I&D based on exam confirmed on bedside US. Procedure per note above.  Moderate size abscess, iodoform gauze used for packing. Given mild surrounding cellulitis will start patient on Doxycycline. Recommended application of warm compresses/soaks/flushing.  Will have patient return for wound recheck in 2 days. I discussed treatment plan, need for follow-up, and return precautions with the patient. Provided opportunity for questions, patient confirmed understanding and is in agreement with plan.          Final Clinical Impression(s) / ED Diagnoses Final diagnoses:  Abscess  Rx / DC Orders ED Discharge Orders          Ordered    doxycycline (VIBRAMYCIN) 100 MG capsule  2 times daily        10/17/22 1131              Janet Berlin 10/17/22 1202    Carmin Muskrat, MD 10/17/22 1332

## 2022-10-17 NOTE — Discharge Instructions (Addendum)
You were seen in the emergency department for a skin abscess- please see the attached handout for further information regarding this diagnoses. This area was incised and drained to help release the bacteria. We would like you to apply warm compresses and warm flushes to this area 4-5 times per day to help facilitate further draining as needed. We are also starting you on doxycycline, an antibiotic, in order to help treat the infection.  You can use ibuprofen and Tylenol as needed for pain control.  We have prescribed you new medication(s) today. Discuss the medications prescribed today with your pharmacist as they can have adverse effects and interactions with your other medicines including over the counter and prescribed medications. Seek medical evaluation if you start to experience new or abnormal symptoms after taking one of these medicines, seek care immediately if you start to experience difficulty breathing, feeling of your throat closing, facial swelling, or rash as these could be indications of a more serious allergic reaction  We would like you to have this area rechecked within 48 hours- please return to the ER , go to an urgent care, or see your primary care provider for this. Return to the ER sooner for new or worsening symptoms including, but not limited to increased pain, spreading redness, fevers, inability to keep fluids down, or any other concerns that you may have.

## 2022-10-17 NOTE — ED Triage Notes (Signed)
Pt states that he has had a cyst on his back x 1 year. Pt states he was seen by dermatology 6 months ago and told to let it be unless it bothers him. Pt states about 2 weeks ago, it started to bother him, so he scheduled an appt with surgery 3/20. However, pt was concerned so he came to ED. NAD

## 2023-03-12 ENCOUNTER — Encounter: Payer: Self-pay | Admitting: Gastroenterology

## 2023-04-10 ENCOUNTER — Ambulatory Visit (AMBULATORY_SURGERY_CENTER): Payer: BC Managed Care – PPO

## 2023-04-10 ENCOUNTER — Encounter: Payer: Self-pay | Admitting: Gastroenterology

## 2023-04-10 VITALS — Ht 71.0 in | Wt 212.0 lb

## 2023-04-10 DIAGNOSIS — Z1211 Encounter for screening for malignant neoplasm of colon: Secondary | ICD-10-CM

## 2023-04-10 MED ORDER — NA SULFATE-K SULFATE-MG SULF 17.5-3.13-1.6 GM/177ML PO SOLN: 1.0000 | Freq: Once | ORAL | 0 refills | Status: AC

## 2023-04-10 NOTE — Progress Notes (Signed)

## 2023-04-26 ENCOUNTER — Encounter: Payer: Self-pay | Admitting: Certified Registered Nurse Anesthetist

## 2023-04-30 ENCOUNTER — Encounter: Payer: Self-pay | Admitting: Gastroenterology

## 2023-04-30 ENCOUNTER — Ambulatory Visit: Payer: BC Managed Care – PPO | Admitting: Gastroenterology

## 2023-04-30 VITALS — BP 126/82 | HR 55 | Temp 98.1°F | Resp 12 | Ht 71.0 in | Wt 212.0 lb

## 2023-04-30 DIAGNOSIS — D123 Benign neoplasm of transverse colon: Secondary | ICD-10-CM

## 2023-04-30 DIAGNOSIS — Z1211 Encounter for screening for malignant neoplasm of colon: Secondary | ICD-10-CM

## 2023-04-30 MED ORDER — SODIUM CHLORIDE 0.9 % IV SOLN: 500.0000 mL | Freq: Once | INTRAVENOUS | Status: DC

## 2023-04-30 NOTE — Progress Notes (Signed)
Called to room to assist during endoscopic procedure.  Patient ID and intended procedure confirmed with present staff. Received instructions for my participation in the procedure from the performing physician.  

## 2023-04-30 NOTE — Patient Instructions (Signed)

## 2023-04-30 NOTE — Progress Notes (Signed)
Report given to PACU, vss 

## 2023-04-30 NOTE — Progress Notes (Signed)
Vitals-CW  Pt's states no medical or surgical changes since previsit or office visit.

## 2023-04-30 NOTE — Op Note (Signed)
[facility] Patient Name: Johnny Day Procedure Date: 04/30/2023 10:32 AM MRN: [id number] Endoscopist: Sherilyn Cooter L. Myrtie Neither , MD, [id number] Age: 47 Referring MD:  Date of Birth: 31-Dec-1975 Gender: Male Account #: [id number] Procedure:                Colonoscopy Indications:              Screening for colorectal malignant neoplasm, This                            is the patient's first colonoscopy Medicines:                Monitored Anesthesia Care Procedure:                Pre-Anesthesia Assessment:                           - Prior to the procedure, a History and Physical                            was performed, and patient medications and                            allergies were reviewed. The patient's tolerance of                            previous anesthesia was also reviewed. The risks                            and benefits of the procedure and the sedation                            options and risks were discussed with the patient.                            All questions were answered, and informed consent                            was obtained. Prior Anticoagulants: The patient has                            taken no anticoagulant or antiplatelet agents. ASA                            Grade Assessment: II - A patient with mild systemic                            disease. After reviewing the risks and benefits,                            the patient was deemed in satisfactory condition to                            undergo the procedure.  After obtaining informed consent, the colonoscope                            was passed under direct vision. Throughout the                            procedure, the patient's blood pressure, pulse, and                            oxygen saturations were monitored continuously. The                            CF [id number] #[id number] was introduced through the anus                            and advanced to the the  cecum, identified by                            appendiceal orifice and ileocecal valve. The                            colonoscopy was performed without difficulty. The                            patient tolerated the procedure well. The quality                            of the bowel preparation was good after lavage. The                            ileocecal valve, appendiceal orifice, and rectum                            were photographed. Scope In: 10:50:50 AM Scope Out: 11:03:19 AM Scope Withdrawal Time: 0 hours 10 minutes 46 seconds  Total Procedure Duration: 0 hours 12 minutes 29 seconds  Findings:                 The perianal and digital rectal examinations were                            normal.                           Repeat examination of right colon under NBI                            performed.                           Multiple diverticula were found in the left colon                            and right colon.  A diminutive polyp was found in the transverse                            colon. The polyp was semi-sessile. The polyp was                            removed with a cold snare. Resection and retrieval                            were complete.                           The exam was otherwise without abnormality on                            direct and retroflexion views. Complications:            No immediate complications. Estimated Blood Loss:     Estimated blood loss was minimal. Impression:               - Diverticulosis in the left colon and in the right                            colon.                           - One diminutive polyp in the transverse colon,                            removed with a cold snare. Resected and retrieved.                           - The examination was otherwise normal on direct                            and retroflexion views. Recommendation:           - Patient has a contact number available for                             emergencies. The signs and symptoms of potential                            delayed complications were discussed with the                            patient. Return to normal activities tomorrow.                            Written discharge instructions were provided to the                            patient.                           - Resume previous diet.                           -  Continue present medications.                           - Await pathology results.                           - Repeat colonoscopy is recommended for                            surveillance. The colonoscopy date will be                            determined after pathology results from today's                            exam become available for review. (Consume more                            water with prep for next exam) Sherilyn Cooter L. Myrtie Neither, MD 04/30/2023 11:07:23 AM This report has been signed electronically.

## 2023-04-30 NOTE — Progress Notes (Signed)
History and Physical:  This patient presents for endoscopic testing for: Encounter Diagnosis  Name Primary?   Special screening for malignant neoplasms, colon Yes    Average risk for colorectal cancer.  First screening exam.  Patient denies chronic abdominal pain, rectal bleeding, constipation or diarrhea.   Patient is otherwise without complaints or active issues today.   Past Medical History: Past Medical History:  Diagnosis Date   Anxiety    Dyslipidemia    Hypertension    RBBB      Past Surgical History: Past Surgical History:  Procedure Laterality Date   PILONIDAL CYST DRAINAGE     TONSILECTOMY/ADENOIDECTOMY WITH MYRINGOTOMY      Allergies: No Known Allergies  Outpatient Meds: Current Outpatient Medications  Medication Sig Dispense Refill   amLODipine (NORVASC) 10 MG tablet Take 10 mg by mouth daily.     atorvastatin (LIPITOR) 10 MG tablet Take 10 mg by mouth daily.     fluticasone (FLONASE) 50 MCG/ACT nasal spray Place into both nostrils as needed for allergies or rhinitis.     losartan-hydrochlorothiazide (HYZAAR) 100-12.5 MG tablet Take 1 tablet by mouth daily.     sildenafil (REVATIO) 20 MG tablet Take 20 mg by mouth as needed.     Current Facility-Administered Medications  Medication Dose Route Frequency Provider Last Rate Last Admin   0.9 %  sodium chloride infusion  500 mL Intravenous Once Sherrilyn Rist, MD          ___________________________________________________________________ Objective   Exam:  BP (!) 144/88   Pulse 61   Temp 98.1 F (36.7 C) (Temporal)   Resp 16   Ht 5\' 11"  (1.803 m)   Wt 212 lb (96.2 kg)   SpO2 100%   BMI 29.57 kg/m   CV: regular , S1/S2 Resp: clear to auscultation bilaterally, normal RR and effort noted GI: soft, no tenderness, with active bowel sounds.   Assessment: Encounter Diagnosis  Name Primary?   Special screening for malignant neoplasms, colon Yes     Plan: Colonoscopy   The benefits  and risks of the planned procedure were described in detail with the patient or (when appropriate) their health care proxy.  Risks were outlined as including, but not limited to, bleeding, infection, perforation, adverse medication reaction leading to cardiac or pulmonary decompensation, pancreatitis (if ERCP).  The limitation of incomplete mucosal visualization was also discussed.  No guarantees or warranties were given.  The patient is appropriate for an endoscopic procedure in the ambulatory setting.   - Amada Jupiter, MD

## 2023-05-01 ENCOUNTER — Telehealth: Payer: Self-pay

## 2023-05-01 NOTE — Telephone Encounter (Signed)
  Follow up Call-     04/30/2023   10:24 AM 04/30/2023   10:21 AM  Call back number  Post procedure Call Back phone  # [phone]   Permission to leave phone message  Yes     Patient questions:  Do you have a fever, pain , or abdominal swelling? No. Pain Score  0 *  Have you tolerated food without any problems? Yes.    Have you been able to return to your normal activities? Yes.    Do you have any questions about your discharge instructions: Diet   No. Medications  No. Follow up visit  No.  Do you have questions or concerns about your Care? No.  Actions: * If pain score is 4 or above: No action needed, pain <4.

## 2023-05-02 LAB — SURGICAL PATHOLOGY

## 2023-05-06 ENCOUNTER — Encounter: Payer: Self-pay | Admitting: Gastroenterology
